# Patient Record
Sex: Male | Born: 2000 | Race: White | Hispanic: No | Marital: Single | Smoking: Never smoker
Health system: Southern US, Community
[De-identification: ages and names within clinical notes are randomized; demographics above are authoritative.]

## PROBLEM LIST (undated history)

## (undated) DIAGNOSIS — Z8669 Personal history of other diseases of the nervous system and sense organs: Secondary | ICD-10-CM

---
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
## 2022-07-06 ENCOUNTER — Ambulatory Visit
Admission: EM | Admit: 2022-07-06 | Discharge: 2022-07-06 | Disposition: A | Discharge status: Home / Self Care | Payer: 59 | Attending: Urgent Care | Admitting: Urgent Care

## 2022-07-06 DIAGNOSIS — L237 Allergic contact dermatitis due to plants, except food: Secondary | ICD-10-CM | POA: Diagnosis not present

## 2022-07-06 MED ORDER — FLUOCINONIDE 0.05 % EX OINT: TOPICAL_OINTMENT | CUTANEOUS | 0 refills | Status: AC

## 2022-07-06 NOTE — ED Triage Notes (Signed)
Patient presents to UC for a rash x 3 days. Located on LUQ area. Not taking any meds.

## 2022-07-06 NOTE — ED Provider Notes (Signed)
Renaldo Fiddler    CSN: [id number] Arrival date & time: 07/06/22  1549      History   Chief Complaint Chief Complaint  Patient presents with   Rash    HPI Matthew Bonilla is a 22 y.o. male.    Rash   Accompanied by his dad.  Patient presents to urgent care with concern for new rash x 3 days.  Rash is located on right upper quadrant of his abdomen.  Not taking medications.  Denies allergies.  No new soaps, lotions, other known allergens.  Patient has another rash on his right arm which he attributes to poison ivy.   Past Medical History:  Diagnosis Date   History of ear infections     There are no problems to display for this patient.   History reviewed. No pertinent surgical history.     Home Medications    Prior to Admission medications   Medication Sig Start Date End Date Taking? Authorizing Provider  amoxicillin-clavulanate (AUGMENTIN) 875-125 MG tablet Take 1 tablet by mouth every 12 (twelve) hours. 01/28/20   Janace Aris, NP    Family History Family History  Problem Relation Age of Onset   Healthy Mother    Healthy Father     Social History Social History   Tobacco Use   Smoking status: Never   Smokeless tobacco: Never  Vaping Use   Vaping Use: Never used  Substance Use Topics   Alcohol use: Never   Drug use: Never     Allergies   Patient has no known allergies.   Review of Systems Review of Systems  Skin:  Positive for rash.     Physical Exam Triage Vital Signs ED Triage Vitals  Enc Vitals Group     BP 07/06/22 1620 122/70     Pulse Rate 07/06/22 1620 70     Resp 07/06/22 1620 18     Temp 07/06/22 1620 98 F (36.7 C)     Temp Source 07/06/22 1620 Oral     SpO2 07/06/22 1620 97 %     Weight --      Height --      Head Circumference --      Peak Flow --      Pain Score 07/06/22 1623 0     Pain Loc --      Pain Edu? --      Excl. in GC? --    No data found.  Updated Vital Signs BP 122/70 (BP Location: Left  Arm)   Pulse 70   Temp 98 F (36.7 C) (Oral)   Resp 18   SpO2 97%   Visual Acuity Right Eye Distance:   Left Eye Distance:   Bilateral Distance:    Right Eye Near:   Left Eye Near:    Bilateral Near:     Physical Exam Vitals reviewed.  Constitutional:      Appearance: Normal appearance.  Skin:    General: Skin is warm and dry.     Findings: Erythema and rash present.  Neurological:     General: No focal deficit present.     Mental Status: He is alert and oriented to person, place, and time.  Psychiatric:        Mood and Affect: Mood normal.        Behavior: Behavior normal.      UC Treatments / Results  Labs (all labs ordered are listed, but only abnormal results are displayed) Labs Reviewed -  No data to display  EKG   Radiology No results found.  Procedures Procedures (including critical care time)  Medications Ordered in UC Medications - No data to display  Initial Impression / Assessment and Plan / UC Course  I have reviewed the triage vital signs and the nursing notes.  Pertinent labs & imaging results that were available during my care of the patient were reviewed by me and considered in my medical decision making (see chart for details).   Erythematous rash is present on the patient's right upper quadrant as well as his right forearm.  Likely poison ivy dermatitis.  Given limited extent of the affected skin, will treat with topical corticosteroid.  Counseled patient on potential for adverse effects with medications prescribed/recommended today, ER and return-to-clinic precautions discussed, patient verbalized understanding and agreement with care plan.  Final Clinical Impressions(s) / UC Diagnoses   Final diagnoses:  None   Discharge Instructions   None    ED Prescriptions   None    PDMP not reviewed this encounter.   Charma Igo, [place] 07/06/22 1642

## 2022-07-06 NOTE — Discharge Instructions (Signed)
Apply the prescribed steroid ointment to the affected skin twice a day until rash is resolved.  Avoid getting the ointment on your face or genital areas.  Follow up here or with your primary care provider if your symptoms are worsening or not improving.
